# Patient Record
Sex: Male | Born: 1985 | Race: Black or African American | Hispanic: No | Marital: Married | Smoking: Never smoker
Health system: Southern US, Community
[De-identification: ages and names within clinical notes are randomized; demographics above are authoritative.]

## PROBLEM LIST (undated history)

## (undated) DIAGNOSIS — M75100 Unspecified rotator cuff tear or rupture of unspecified shoulder, not specified as traumatic: Secondary | ICD-10-CM

## (undated) DIAGNOSIS — M67921 Unspecified disorder of synovium and tendon, right upper arm: Secondary | ICD-10-CM

## (undated) HISTORY — PX: KNEE ARTHROSCOPY W/ ACL RECONSTRUCTION: SHX1858

---

## 2014-11-28 ENCOUNTER — Emergency Department (INDEPENDENT_AMBULATORY_CARE_PROVIDER_SITE_OTHER)
Admission: EM | Admit: 2014-11-28 | Discharge: 2014-11-28 | Disposition: A | Discharge status: Home / Self Care | Payer: Worker's Compensation | Source: Home / Self Care | Attending: Family Medicine | Admitting: Family Medicine

## 2014-11-28 DIAGNOSIS — S46211A Strain of muscle, fascia and tendon of other parts of biceps, right arm, initial encounter: Secondary | ICD-10-CM | POA: Diagnosis not present

## 2014-11-28 DIAGNOSIS — S39012A Strain of muscle, fascia and tendon of lower back, initial encounter: Secondary | ICD-10-CM | POA: Diagnosis not present

## 2014-11-28 MED ORDER — MELOXICAM 15 MG PO TABS: 15.0000 mg | ORAL_TABLET | Freq: Every day | ORAL | Status: DC

## 2014-11-28 MED ORDER — CYCLOBENZAPRINE HCL 5 MG PO TABS: ORAL_TABLET | ORAL | Status: DC

## 2014-11-28 NOTE — ED Provider Notes (Signed)
CSN: [id number]     Arrival date & time 11/28/14  1191 History   First MD Initiated Contact with Patient 11/28/14 1027     Chief Complaint  Patient presents with  . Back Pain     HPI Comments: Patient presents for an occupational injury.  He has two complaints: 1)  Yesterday while moving a stack of loaded pallets with a pallet jack, the pallets began to fall and he instinctively tried to catch them.  He consequently felt soreness in his lower back, worse on the right.  The pain was significantly worse this morning, but does not radiated.   He denies bowel or bladder dysfunction, and no saddle numbness.  The pain is worse with any movement. 2)  Three days ago while lifting crates overhead he felt vague pain in his right shoulder that has persisted.    Patient is a 29 y.o. male presenting with back pain. The history is provided by the patient.  Back Pain   History reviewed. No pertinent past medical history. Past Surgical History  Procedure Laterality Date  . Anterior cruciate ligament repair    . Knee arthroscopy w/ meniscal repair    . Knee arthroscopy w/ medial collateral ligament (mcl) repair     History reviewed. No pertinent family history. Social History  Substance Use Topics  . Smoking status: Never Smoker   . Smokeless tobacco: Never Used  . Alcohol Use: No    Review of Systems  Constitutional: Negative.   HENT: Negative.   Eyes: Negative.   Respiratory: Negative.   Cardiovascular: Negative.   Gastrointestinal: Negative.   Genitourinary: Negative.   Musculoskeletal: Positive for back pain.       Right shoulder pain  Skin: Negative.     Allergies  Review of patient's allergies indicates not on file.  Home Medications   Prior to Admission medications   Medication Sig Start Date End Date Taking? Authorizing Provider  cyclobenzaprine (FLEXERIL) 5 MG tablet Take one tab by mouth at bedtime for muscle spasm 11/28/14   Lattie Haw, MD  meloxicam (MOBIC) 15 MG  tablet Take 1 tablet (15 mg total) by mouth daily. Take with food each morning 11/28/14   Lattie Haw, MD   Meds Ordered and Administered this Visit  Medications - No data to display  BP 139/77 mmHg  Pulse 58  Temp(Src) 98.6 F (37 C) (Oral)  Ht 5\' 10"  (1.778 m)  Wt 176 lb 8 oz (80.06 kg)  BMI 25.33 kg/m2  SpO2 100% No data found.   Physical Exam  Constitutional: He is oriented to person, place, and time. He appears well-developed and well-nourished.  HENT:  Head: Normocephalic.  Nose: Nose normal.  Mouth/Throat: Oropharynx is clear and moist.  Eyes: Conjunctivae are normal. Pupils are equal, round, and reactive to light.  Neck: Normal range of motion.  Cardiovascular: Normal heart sounds.   Pulmonary/Chest: Breath sounds normal.  Musculoskeletal:       Right shoulder: He exhibits tenderness. He exhibits normal range of motion, no bony tenderness, no swelling, no crepitus and no deformity.       Lumbar back: He exhibits tenderness and spasm. He exhibits normal range of motion, no bony tenderness, no swelling and no deformity.       Back:       Arms: Right shoulder has full range of motion.  There is distinct tenderness to palpation over the long head of the biceps tendon.  Yergason's test is positive.  Back:  Can heel/toe walk and squat without difficulty. Relatively good range of motion.  Tenderness in the lower thoracic and upper lumbar paraspinous muscles as noted on diagram.    Straight leg raising test is negative.  Sitting knee extension test is negative.  Strength and sensation in the lower extremities is normal.  Patellar and achilles reflexes are normal   Neurological: He is alert and oriented to person, place, and time.  Skin: Skin is warm and dry. No rash noted.  Nursing note and vitals reviewed.   ED Course  Procedures  None     MDM   1. Low back strain, initial encounter   2. Strain of biceps tendon, right, initial encounter    Begin Mobic   daily, and Flexeril  HS Apply ice pack for 20 to 30 minutes, 3 to 4 times daily  Continue until pain decreases.  Begin range of motion and stretching exercises as tolerated; instructions given. Return in one week for follow-up.  Work Restrictions:  Return to work 12/01/14, then no lifting until after follow-up visit.  Lattie Haw, MD 12/04/14 [id number]

## 2014-11-28 NOTE — ED Notes (Signed)
Workers comp- Stated that he tweeked his right shoulder lifting crates at work on Tuesday, did not report, but at times uncomfortable Thursday had a"full pallet of sodas that were stacked wrong and the entire lift fell.  I tried to catch them, and twisted my back"   Stated that back is tight and it is hard to find a comfortable position Hurts when twisting and moving, denies pain in extremities and neck

## 2014-11-28 NOTE — Discharge Instructions (Signed)
Apply ice pack for 20 to 30 minutes, 3 to 4 times daily  Continue until pain decreases.  °Begin range of motion and stretching exercises as tolerated. °

## 2014-12-05 ENCOUNTER — Emergency Department (INDEPENDENT_AMBULATORY_CARE_PROVIDER_SITE_OTHER)
Admission: EM | Admit: 2014-12-05 | Discharge: 2014-12-05 | Disposition: A | Discharge status: Home / Self Care | Payer: Worker's Compensation | Source: Home / Self Care | Attending: Family Medicine | Admitting: Family Medicine

## 2014-12-05 ENCOUNTER — Emergency Department (INDEPENDENT_AMBULATORY_CARE_PROVIDER_SITE_OTHER): Payer: Self-pay

## 2014-12-05 ENCOUNTER — Encounter: Payer: Self-pay | Admitting: *Deleted

## 2014-12-05 DIAGNOSIS — M25511 Pain in right shoulder: Secondary | ICD-10-CM

## 2014-12-05 DIAGNOSIS — M545 Low back pain: Secondary | ICD-10-CM

## 2014-12-05 DIAGNOSIS — M25522 Pain in left elbow: Secondary | ICD-10-CM

## 2014-12-05 DIAGNOSIS — S39012D Strain of muscle, fascia and tendon of lower back, subsequent encounter: Secondary | ICD-10-CM

## 2014-12-05 DIAGNOSIS — M546 Pain in thoracic spine: Secondary | ICD-10-CM

## 2014-12-05 DIAGNOSIS — M7702 Medial epicondylitis, left elbow: Secondary | ICD-10-CM

## 2014-12-05 MED ORDER — PREDNISONE 20 MG PO TABS: ORAL_TABLET | ORAL | Status: DC

## 2014-12-05 NOTE — ED Notes (Signed)
Pt is here today for w/c follow up for his back injury from 11/28/14. He reports that his upper back pain is no better.

## 2014-12-05 NOTE — ED Provider Notes (Signed)
CSN: [id number]     Arrival date & time 12/05/14  4098 History   First MD Initiated Contact with Patient 12/05/14 [phone]     Chief Complaint  Patient presents with  . Back Pain  . Shoulder Pain  . Elbow Pain   (Consider location/radiation/quality/duration/timing/severity/associated sxs/prior Treatment) HPI  Pt is a 29yo male presenting to [facility] for recheck of back and shoulder pain due to an injury at work on 11/28/14.  Pt works for [employer] , reports trying to catch a falling pallet, resulting in him straining his back and injuring Right shoulder.  Pt admits he did not start taking medication, Meloxicam and Flexeril, until yesterday as he had forgotten it at a family member's house.  Pain has only improved minimally.  Pain in Right shoulder is aching and sore in front of shoulder, worse with heavy lifting.  Back pain is "right between my shoulder blades" worse with heavy lifting and certain movements. Pt also reports pain to medial elbow of Left arm. Pain is aching and sore, worse with palpation and lifting.  Pain is 6/10 at worst. Denies numbness or tingling in arms or legs. Denies change in bowel or bladder habits. Denies prior surgeries to back, shoulder, or elbow. Denies rashes, erythema or bruising.   States imaging had been discussed during his initial visit but was never done.  Pt returned to work on 08/31/14, was suppose to not be lifting until today's follow up but pt states "it didn't work out that way, i had to lift some things."   History reviewed. No pertinent past medical history. Past Surgical History  Procedure Laterality Date  . Anterior cruciate ligament repair    . Knee arthroscopy w/ meniscal repair    . Knee arthroscopy w/ medial collateral ligament (mcl) repair     History reviewed. No pertinent family history. Social History  Substance Use Topics  . Smoking status: Never Smoker   . Smokeless tobacco: Never Used  . Alcohol Use: No    Review of Systems  Constitutional:  Negative for fever and chills.  Gastrointestinal: Negative for nausea, vomiting, abdominal pain and diarrhea.  Genitourinary: Negative for dysuria and flank pain.  Musculoskeletal: Positive for myalgias, back pain and arthralgias. Negative for joint swelling, gait problem, neck pain and neck stiffness.  Skin: Negative for color change, rash and wound.  Neurological: Negative for weakness and numbness.    Allergies  Other  Home Medications   Prior to Admission medications   Medication Sig Start Date End Date Taking? Authorizing Provider  cyclobenzaprine (FLEXERIL) 5 MG tablet Take one tab by mouth at bedtime for muscle spasm 11/28/14   Lattie Haw, MD  meloxicam (MOBIC) 15 MG tablet Take 1 tablet (15 mg total) by mouth daily. Take with food each morning 11/28/14   Lattie Haw, MD  predniSONE (DELTASONE) 20 MG tablet 3 tabs po day one, then 2 po daily x 4 days 12/05/14   Junius Finner, PA-C   Meds Ordered and Administered this Visit  Medications - No data to display  BP 126/79 mmHg  Pulse 68  Temp(Src) 98.5 F (36.9 C) (Oral)  Resp 16  SpO2 100% No data found.   Physical Exam  Constitutional: He is oriented to person, place, and time. He appears well-developed and well-nourished.  HENT:  Head: Normocephalic and atraumatic.  Eyes: EOM are normal.  Neck: Normal range of motion. Neck supple.  No midline bone tenderness, no crepitus or step-offs.   Cardiovascular: Normal rate, regular  rhythm and normal heart sounds.   Pulses:      Radial pulses are 2+ on the right side, and 2+ on the left side.  Pulmonary/Chest: Effort normal and breath sounds normal. No respiratory distress. He has no wheezes. He has no rales. He exhibits no tenderness.  Musculoskeletal: Normal range of motion. He exhibits tenderness. He exhibits no edema.  Midline tenderness to thoracic and upper lumbar spine as well as surrounding paraspinal muscles. Full ROM upper and lower extremities bilaterally.  Negative straight leg raise. Right shoulder: full ROM, tenderness over bicipital groove and AC joint.  Left elbow: tenderness over medial epicondyl increased pain with pronation against resistance.   Neurological: He is alert and oriented to person, place, and time.  Sensation normal in all extremities. Normal gait. Able to squat and bend without difficulty.   Skin: Skin is warm and dry. No rash noted. No erythema.  Skin in tact, no erythema or warmth. No rashes.  Psychiatric: He has a normal mood and affect. His behavior is normal.  Nursing note and vitals reviewed.   ED Course  Procedures (including critical care time)  Labs Review Labs Reviewed - No data to display  Imaging Review Dg Thoracic Spine 2 View  12/05/2014   CLINICAL DATA:  Initial encounter for upper mid back pain for 1.5 weeks.  EXAM: THORACIC SPINE 2 VIEWS  COMPARISON:  None.  FINDINGS: There is no evidence of thoracic spine fracture. Alignment is normal. No other significant bone abnormalities are identified.  IMPRESSION: Negative.   Electronically Signed   By: Kennith Center M.D.   On: 12/05/2014 09:49   Dg Lumbar Spine Complete  12/05/2014   CLINICAL DATA:  Acute lower back pain for several weeks. Heavy lifting at work.  EXAM: LUMBAR SPINE - COMPLETE 4+ VIEW  COMPARISON:  None.  FINDINGS: There is no evidence of lumbar spine fracture. Alignment is normal. Intervertebral disc spaces are maintained.  IMPRESSION: Normal lumbar spine.   Electronically Signed   By: Lupita Raider, M.D.   On: 12/05/2014 09:49   Dg Shoulder Right  12/05/2014   CLINICAL DATA:  Initial encounter for 1.5 week history of right shoulder pain  EXAM: RIGHT SHOULDER - 2+ VIEW  COMPARISON:  None.  FINDINGS: There is no evidence of fracture or dislocation. There is no evidence of arthropathy or other focal bone abnormality. Soft tissues are unremarkable.  IMPRESSION: Negative.   Electronically Signed   By: Kennith Center M.D.   On: 12/05/2014 09:50   Dg  Elbow Complete Left  12/05/2014   CLINICAL DATA:  Medial lt elbow pain xwks. Works for [employer] so does heavy lifting.  EXAM: LEFT ELBOW - COMPLETE 3+ VIEW  COMPARISON:  None.  FINDINGS: There is no evidence of fracture, dislocation, or joint effusion. There is no evidence of arthropathy or other focal bone abnormality. Soft tissues are unremarkable.  IMPRESSION: Negative.   Electronically Signed   By: Amie Portland M.D.   On: 12/05/2014 09:50      MDM   1. Back strain, subsequent encounter   2. Right shoulder pain   3. Medial epicondylitis of left elbow    Pt presenting for recheck of back pain from lifting heavy pellet at work on 11/28/14.  Pt still tender on thoracic and upper lumbar spine as well as Right shoulder and medial elbow.  Plain films: negative for joint or bony abnormalities.  Back pain likely due to continued back strain. Right shoulder pain: biceps  tendonitis vs subacromial bursitis  Left elbow: medial epicondylitis, elbow strap/brace provided at [facility]  Encouraged to keep taking prescriptions as prescribed last week as pt only started taking yesterday-- Meloxicam  daily, and Flexeril  at bedtime for muscle spasm.  Rx: prednisone-  today,  daily for 4 days.  Encouraged ice, rest, and gentle exercises as tolerated.  Worker's Comp Information   Return To Work: 12/08/14   Work Restrictions: no heavy lifting, greater than 25 pounds, until follow up with Occupational Health or Sports Medicine   Referral (if indicated): Sports Medicine, Dr. Denyse Amass or Dr. Benjamin Stain  MAKE FOLLOW-UP APPOINTMENT: referral given to occupational health, advised pt will be contacted to schedule an appointment.     Patient verbalized understanding and agreement with treatment plan.     Junius Finner, PA-C 12/05/14 1042

## 2014-12-05 NOTE — Discharge Instructions (Signed)
Please continue to take your Meloxicam and Flexeril as prescribed for pain.  Please also start taking oral prednisone to help with inflammation in your back and joints.  See below for further instructions.

## 2014-12-10 ENCOUNTER — Emergency Department (INDEPENDENT_AMBULATORY_CARE_PROVIDER_SITE_OTHER): Payer: Self-pay

## 2014-12-10 ENCOUNTER — Emergency Department (INDEPENDENT_AMBULATORY_CARE_PROVIDER_SITE_OTHER)
Admission: EM | Admit: 2014-12-10 | Discharge: 2014-12-10 | Disposition: A | Discharge status: Home / Self Care | Payer: Worker's Compensation | Source: Home / Self Care | Attending: Family Medicine | Admitting: Family Medicine

## 2014-12-10 DIAGNOSIS — M25531 Pain in right wrist: Secondary | ICD-10-CM

## 2014-12-10 DIAGNOSIS — M545 Low back pain, unspecified: Secondary | ICD-10-CM

## 2014-12-10 DIAGNOSIS — S4991XS Unspecified injury of right shoulder and upper arm, sequela: Secondary | ICD-10-CM | POA: Diagnosis not present

## 2014-12-10 DIAGNOSIS — M25511 Pain in right shoulder: Secondary | ICD-10-CM
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
## 2014-12-10 NOTE — ED Notes (Signed)
Sling placed on right arm.

## 2014-12-10 NOTE — Discharge Instructions (Signed)
You may continue to take previously prescribed meloxicam and flexeril.
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
## 2015-01-02 ENCOUNTER — Emergency Department (INDEPENDENT_AMBULATORY_CARE_PROVIDER_SITE_OTHER)
Admission: EM | Admit: 2015-01-02 | Discharge: 2015-01-02 | Disposition: A | Discharge status: Home / Self Care | Payer: Worker's Compensation | Source: Home / Self Care | Attending: Family Medicine | Admitting: Family Medicine

## 2015-01-02 ENCOUNTER — Encounter: Payer: Self-pay | Admitting: Emergency Medicine

## 2015-01-02 DIAGNOSIS — S39012D Strain of muscle, fascia and tendon of lower back, subsequent encounter: Secondary | ICD-10-CM | POA: Diagnosis not present

## 2015-01-02 DIAGNOSIS — M62830 Muscle spasm of back: Secondary | ICD-10-CM
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
## 2015-06-04 ENCOUNTER — Other Ambulatory Visit: Payer: Self-pay | Admitting: Orthopedic Surgery

## 2015-06-13 DIAGNOSIS — M67921 Unspecified disorder of synovium and tendon, right upper arm: Secondary | ICD-10-CM

## 2015-06-13 DIAGNOSIS — M75100 Unspecified rotator cuff tear or rupture of unspecified shoulder, not specified as traumatic: Secondary | ICD-10-CM

## 2015-06-13 HISTORY — DX: Unspecified rotator cuff tear or rupture of unspecified shoulder, not specified as traumatic: M75.100

## 2015-06-13 HISTORY — DX: Unspecified disorder of synovium and tendon, right upper arm: M67.921

## 2015-06-29 ENCOUNTER — Encounter (HOSPITAL_BASED_OUTPATIENT_CLINIC_OR_DEPARTMENT_OTHER): Payer: Self-pay | Admitting: *Deleted

## 2015-07-06 ENCOUNTER — Ambulatory Visit (HOSPITAL_BASED_OUTPATIENT_CLINIC_OR_DEPARTMENT_OTHER)
Admission: RE | Admit: 2015-07-06 | Payer: Worker's Compensation | Source: Ambulatory Visit | Admitting: Orthopedic Surgery

## 2015-07-06 HISTORY — DX: Unspecified rotator cuff tear or rupture of unspecified shoulder, not specified as traumatic: M75.100

## 2015-07-06 HISTORY — DX: Unspecified disorder of synovium and tendon, right upper arm: M67.921

## 2015-07-06 SURGERY — SHOULDER ARTHROSCOPY WITH ROTATOR CUFF REPAIR AND OPEN BICEPS TENODESIS
Anesthesia: Choice | Laterality: Right
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
